# Patient Record
Sex: Male | Born: 1975 | Race: White | Hispanic: No | Smoking: Former smoker
Health system: Southern US, Community
[De-identification: ages and names within clinical notes are randomized; demographics above are authoritative.]

---

## 2005-08-05 ENCOUNTER — Ambulatory Visit: Payer: Self-pay | Admitting: Orthopaedic Surgery

## 2007-01-16 ENCOUNTER — Emergency Department: Payer: Self-pay | Admitting: Emergency Medicine

## 2007-01-16 ENCOUNTER — Other Ambulatory Visit: Payer: Self-pay

## 2013-12-23 ENCOUNTER — Emergency Department: Payer: Self-pay | Admitting: Emergency Medicine

## 2013-12-23 LAB — CBC
HCT: 40.2 % (ref 40.0–52.0)
HGB: 13.8 g/dL (ref 13.0–18.0)
MCH: 31.6 pg (ref 26.0–34.0)
MCHC: 34.3 g/dL (ref 32.0–36.0)
MCV: 92 fL (ref 80–100)
PLATELETS: 185 10*3/uL (ref 150–440)
RBC: 4.36 10*6/uL — ABNORMAL LOW (ref 4.40–5.90)
RDW: 14.3 % (ref 11.5–14.5)
WBC: 5.7 10*3/uL (ref 3.8–10.6)

## 2013-12-23 LAB — TSH: Thyroid Stimulating Horm: 0.498 u[IU]/mL

## 2013-12-23 LAB — BASIC METABOLIC PANEL
Anion Gap: 3 — ABNORMAL LOW (ref 7–16)
BUN: 11 mg/dL (ref 7–18)
CO2: 29 mmol/L (ref 21–32)
Calcium, Total: 9.1 mg/dL (ref 8.5–10.1)
Chloride: 105 mmol/L (ref 98–107)
Creatinine: 0.81 mg/dL (ref 0.60–1.30)
EGFR (African American): >60
EGFR (Non-African Amer.): >60
GLUCOSE: 90 mg/dL (ref 65–99)
Osmolality: 273 (ref 275–301)
Potassium: 3.7 mmol/L (ref 3.5–5.1)
Sodium: 137 mmol/L (ref 136–145)

## 2013-12-23 LAB — TROPONIN I: Troponin-I: <0.02 ng/mL

## 2017-06-02 ENCOUNTER — Encounter: Payer: Self-pay | Admitting: Emergency Medicine

## 2017-06-02 DIAGNOSIS — S161XXA Strain of muscle, fascia and tendon at neck level, initial encounter: Secondary | ICD-10-CM | POA: Diagnosis not present

## 2017-06-02 DIAGNOSIS — Y92410 Unspecified street and highway as the place of occurrence of the external cause: Secondary | ICD-10-CM | POA: Insufficient documentation

## 2017-06-02 DIAGNOSIS — Y939 Activity, unspecified: Secondary | ICD-10-CM | POA: Diagnosis not present

## 2017-06-02 DIAGNOSIS — S39012A Strain of muscle, fascia and tendon of lower back, initial encounter: Secondary | ICD-10-CM | POA: Diagnosis not present

## 2017-06-02 DIAGNOSIS — S199XXA Unspecified injury of neck, initial encounter: Secondary | ICD-10-CM | POA: Diagnosis present

## 2017-06-02 DIAGNOSIS — Y999 Unspecified external cause status: Secondary | ICD-10-CM | POA: Diagnosis not present

## 2017-06-02 DIAGNOSIS — Z87891 Personal history of nicotine dependence: Secondary | ICD-10-CM | POA: Diagnosis not present
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
## 2017-06-03 ENCOUNTER — Emergency Department
Admission: EM | Admit: 2017-06-03 | Discharge: 2017-06-03 | Disposition: A | Discharge status: Home / Self Care | Payer: BLUE CROSS/BLUE SHIELD | Attending: Emergency Medicine | Admitting: Emergency Medicine

## 2017-06-03 ENCOUNTER — Emergency Department: Payer: BLUE CROSS/BLUE SHIELD

## 2017-06-03 DIAGNOSIS — S161XXA Strain of muscle, fascia and tendon at neck level, initial encounter: Secondary | ICD-10-CM

## 2017-06-03 DIAGNOSIS — S39012A Strain of muscle, fascia and tendon of lower back, initial encounter: Secondary | ICD-10-CM

## 2017-06-03 MED ORDER — ACETAMINOPHEN 325 MG PO TABS
ORAL_TABLET | ORAL | Status: AC
  Administered 2017-06-03: 650 mg via ORAL
  Filled 2017-06-03: qty 2

## 2017-06-03 MED ORDER — CYCLOBENZAPRINE HCL 10 MG PO TABS: 10.0000 mg | ORAL_TABLET | Freq: Three times a day (TID) | ORAL | 0 refills | Status: AC | PRN

## 2017-06-03 MED ORDER — CYCLOBENZAPRINE HCL 10 MG PO TABS
10.0000 mg | ORAL_TABLET | Freq: Once | ORAL | Status: AC
  Administered 2017-06-03: 10 mg via ORAL
  Filled 2017-06-03: qty 1

## 2017-06-03 MED ORDER — ACETAMINOPHEN 325 MG PO TABS
650.0000 mg | ORAL_TABLET | Freq: Once | ORAL | Status: AC
  Administered 2017-06-03: 650 mg via ORAL

## 2017-06-03 NOTE — ED Notes (Signed)
Reviewed d/c instructions, follow-up care, prescription with patient. Pt verbalized understanding.  

## 2017-06-03 NOTE — ED Notes (Signed)
Patient tender to palpation of left shoulder.
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
## 2024-04-23 ENCOUNTER — Ambulatory Visit: Payer: Self-pay

## 2024-04-23 NOTE — Telephone Encounter (Signed)
 FYI Only or Action Required?: NA not established  Patient was last seen in primary care on NA. Called Nurse Triage reporting Establish care, fatigue, sore chest vs sob. Symptoms began several days ago. Interventions attempted: Other: urgent care, rest. Symptoms are: gradually improving.  Triage Disposition: See Physician Within 24 Hours  Patient/caregiver understands and will follow disposition?: Unsure  Copied from CRM [id number]. Topic: Clinical - Red Word Triage >> Apr 23, 2024  9:51 AM Jerome Hunter wrote: Red Word that prompted transfer to Nurse Triage:  sob and fatigue. Patient went to urgent care on Thursday for a possible anxiety attack. Reason for Disposition  [1] Chest pain lasts < 5 minutes AND [2] NO chest pain or cardiac symptoms (e.g., breathing difficulty, sweating) now  (Exception: Chest pains that last only a few seconds.)  Answer Assessment - Initial Assessment Questions 1. LOCATION: "Where does it hurt?"       Chest discomfort 2. RADIATION: "Does the pain go anywhere else?" (e.g., into neck, jaw, arms, back)     denies 3. ONSET: "When did the chest pain begin?" (Minutes, hours or days)      Several days ago 4. PATTERN: "Does the pain come and go, or has it been constant since it started?"  "Does it get worse with exertion?"      Intermittent  5. DURATION: "How long does it last" (e.g., seconds, minutes, hours)      6. SEVERITY: "How bad is the pain?"  (e.g., Scale 1-10; mild, moderate, or severe)    - MILD (1-3): doesn't interfere with normal activities     - MODERATE (4-7): interferes with normal activities or awakens from sleep    - SEVERE (8-10): excruciating pain, unable to do any normal activities       mild 7. CARDIAC RISK FACTORS: "Do you have any history of heart problems or risk factors for heart disease?" (e.g., angina, prior heart attack; diabetes, high blood pressure, high cholesterol, smoker, or strong family history of heart disease)     htn 8. PULMONARY  RISK FACTORS: "Do you have any history of lung disease?"  (e.g., blood clots in lung, asthma, emphysema, birth control pills)     NA 9. CAUSE: "What do you think is causing the chest pain?"     Anxiety  10. OTHER SYMPTOMS: "Do you have any other symptoms?" (e.g., dizziness, nausea, vomiting, sweating, fever, difficulty breathing, cough)       Shortness of breath intermittent with ambulation. Anxiety increasing, no diagnosis.  Blood pressure yesterday 175/98, now 115/81, hr 59 Appointment scheduled to establish care, patient will seek evaluation at UC/ER for acute symptoms  Protocols used: Chest Pain-A-AH

## 2024-05-18 ENCOUNTER — Encounter: Payer: Self-pay | Admitting: Family Medicine

## 2024-05-18 ENCOUNTER — Telehealth: Payer: Self-pay

## 2024-05-18 ENCOUNTER — Ambulatory Visit: Admitting: Family Medicine

## 2024-05-18 VITALS — BP 136/78 | HR 70 | Ht 69.69 in | Wt 158.0 lb

## 2024-05-18 DIAGNOSIS — Z7689 Persons encountering health services in other specified circumstances: Secondary | ICD-10-CM

## 2024-05-18 DIAGNOSIS — Z720 Tobacco use: Secondary | ICD-10-CM | POA: Insufficient documentation

## 2024-05-18 DIAGNOSIS — Z8659 Personal history of other mental and behavioral disorders: Secondary | ICD-10-CM | POA: Diagnosis not present

## 2024-05-18 DIAGNOSIS — M5441 Lumbago with sciatica, right side: Secondary | ICD-10-CM

## 2024-05-18 DIAGNOSIS — G8929 Other chronic pain: Secondary | ICD-10-CM | POA: Insufficient documentation

## 2024-05-18 DIAGNOSIS — Z842 Family history of other diseases of the genitourinary system: Secondary | ICD-10-CM

## 2024-05-18 DIAGNOSIS — F5102 Adjustment insomnia: Secondary | ICD-10-CM

## 2024-05-18 DIAGNOSIS — Z1211 Encounter for screening for malignant neoplasm of colon: Secondary | ICD-10-CM

## 2024-05-18 DIAGNOSIS — Z130 Encounter for screening for diseases of the blood and blood-forming organs and certain disorders involving the immune mechanism: Secondary | ICD-10-CM

## 2024-05-18 DIAGNOSIS — Z13220 Encounter for screening for lipoid disorders: Secondary | ICD-10-CM

## 2024-05-18 DIAGNOSIS — Z1159 Encounter for screening for other viral diseases: Secondary | ICD-10-CM

## 2024-05-18 DIAGNOSIS — F411 Generalized anxiety disorder: Secondary | ICD-10-CM | POA: Diagnosis not present

## 2024-05-18 DIAGNOSIS — F410 Panic disorder [episodic paroxysmal anxiety] without agoraphobia: Secondary | ICD-10-CM | POA: Diagnosis not present

## 2024-05-18 DIAGNOSIS — F430 Acute stress reaction: Secondary | ICD-10-CM

## 2024-05-18 DIAGNOSIS — Z131 Encounter for screening for diabetes mellitus: Secondary | ICD-10-CM

## 2024-05-18 DIAGNOSIS — Z114 Encounter for screening for human immunodeficiency virus [HIV]: Secondary | ICD-10-CM

## 2024-05-18 MED ORDER — NA SULFATE-K SULFATE-MG SULF 17.5-3.13-1.6 GM/177ML PO SOLN: 1.0000 | Freq: Once | ORAL | 0 refills | Status: AC
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
## 2024-05-18 NOTE — Telephone Encounter (Signed)
 Gastroenterology Pre-Procedure Review  Request Date: 08/11/24 Requesting Physician: Dr. Jinny  PATIENT REVIEW QUESTIONS: The patient responded to the following health history questions as indicated:    1. Are you having any GI issues? no 2. Do you have a personal history of Polyps? no 3. Do you have a family history of Colon Cancer or Polyps? no 4. Diabetes Mellitus? no 5. Joint replacements in the past 12 months?no 6. Major health problems in the past 3 months?no 7. Any artificial heart valves, MVP, or defibrillator?no    MEDICATIONS & ALLERGIES:    Patient reports the following regarding taking any anticoagulation/antiplatelet therapy:   Plavix, Coumadin, Eliquis, Xarelto, Lovenox, Pradaxa, Brilinta, or Effient? no Aspirin? no  Patient confirms/reports the following medications:  Current Outpatient Medications  Medication Sig Dispense Refill   cyclobenzaprine  (FLEXERIL ) 10 MG tablet Take 1 tablet (10 mg total) by mouth 3 (three) times daily as needed. (Patient not taking: Reported on 05/18/2024) 30 tablet 0   No current facility-administered medications for this visit.    Patient confirms/reports the following allergies:  No Known Allergies  No orders of the defined types were placed in this encounter.   AUTHORIZATION INFORMATION Primary Insurance: 1D#: Group #:  Secondary Insurance: 1D#: Group #:  SCHEDULE INFORMATION: Date: 08/11/24 Time: Location: [facility]

## 2024-05-18 NOTE — Assessment & Plan Note (Signed)
 Flowsheet Row Office Visit from 05/18/2024 in [facility]  PHQ-9 Total Score 5    Chronic He experienced a significant anxiety attack a month ago, prompting a primary care visit. Family history includes severe social anxiety in his mother and brother. He reports ongoing stress from personal circumstances, including animal care and financial issues. He has not used medication recently but uses nicotine and cannabis for symptom management. Therapy, particularly cognitive behavioral therapy, was discussed as beneficial, though he is skeptical but acknowledges potential benefits. - Refer to psychiatry for cognitive behavioral therapy for anxiety and stress management.
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
## 2024-05-18 NOTE — Assessment & Plan Note (Signed)
 Chronic Diagnosed with ADHD in childhood, he has not used medication. He uses nicotine and cannabis for focus and stress. He is reluctant to start prescription medication, preferring non-pharmacological management to avoid altering brain chemistry. - Discuss ADHD management options with psychiatry, focusing on non-pharmacological approaches. - advised against nicotine and illicit substances for management of mood symptoms

## 2024-05-19 ENCOUNTER — Ambulatory Visit: Payer: Self-pay | Admitting: Family Medicine

## 2024-05-19 LAB — LIPID PANEL
Chol/HDL Ratio: 4.3 ratio (ref 0.0–5.0)
Cholesterol, Total: 187 mg/dL (ref 100–199)
HDL: 44 mg/dL (ref >39)
LDL Chol Calc (NIH): 111 mg/dL — ABNORMAL HIGH (ref 0–99)
Triglycerides: 185 mg/dL — ABNORMAL HIGH (ref 0–149)
VLDL Cholesterol Cal: 32 mg/dL (ref 5–40)

## 2024-05-19 LAB — CMP14+EGFR
ALT: 14 IU/L (ref 0–44)
AST: 14 IU/L (ref 0–40)
Albumin: 4.4 g/dL (ref 4.1–5.1)
Alkaline Phosphatase: 59 IU/L (ref 44–121)
BUN/Creatinine Ratio: 14 (ref 9–20)
BUN: 9 mg/dL (ref 6–24)
Bilirubin Total: <0.2 mg/dL (ref 0.0–1.2)
CO2: 22 mmol/L (ref 20–29)
Calcium: 9.7 mg/dL (ref 8.7–10.2)
Chloride: 103 mmol/L (ref 96–106)
Creatinine, Ser: 0.66 mg/dL — ABNORMAL LOW (ref 0.76–1.27)
Globulin, Total: 2.2 g/dL (ref 1.5–4.5)
Glucose: 88 mg/dL (ref 70–99)
Potassium: 4.3 mmol/L (ref 3.5–5.2)
Sodium: 141 mmol/L (ref 134–144)
Total Protein: 6.6 g/dL (ref 6.0–8.5)
eGFR: 116 mL/min/1.73

## 2024-05-19 LAB — CBC
Hematocrit: 44 % (ref 37.5–51.0)
Hemoglobin: 14.5 g/dL (ref 13.0–17.7)
MCH: 31.3 pg (ref 26.6–33.0)
MCHC: 33 g/dL (ref 31.5–35.7)
MCV: 95 fL (ref 79–97)
Platelets: 234 x10E3/uL (ref 150–450)
RBC: 4.63 x10E6/uL (ref 4.14–5.80)
RDW: 12.8 % (ref 11.6–15.4)
WBC: 4.9 x10E3/uL (ref 3.4–10.8)

## 2024-05-19 LAB — TSH+T4F+T3FREE
Free T4: 1.02 ng/dL (ref 0.82–1.77)
T3, Free: 4 pg/mL (ref 2.0–4.4)
TSH: 0.217 u[IU]/mL — ABNORMAL LOW (ref 0.450–4.500)

## 2024-05-19 LAB — HEMOGLOBIN A1C
Est. average glucose Bld gHb Est-mCnc: 111 mg/dL
Hgb A1c MFr Bld: 5.5 % (ref 4.8–5.6)

## 2024-05-19 LAB — HEPATITIS C ANTIBODY: Hep C Virus Ab: NONREACTIVE

## 2024-05-19 LAB — PSA TOTAL (REFLEX TO FREE): Prostate Specific Ag, Serum: 0.4 ng/mL (ref 0.0–4.0)

## 2024-05-19 LAB — HIV ANTIBODY (ROUTINE TESTING W REFLEX): HIV Screen 4th Generation wRfx: NONREACTIVE

## 2024-08-11 ENCOUNTER — Other Ambulatory Visit: Payer: Self-pay

## 2024-08-11 ENCOUNTER — Telehealth: Payer: Self-pay

## 2024-08-11 DIAGNOSIS — Z1211 Encounter for screening for malignant neoplasm of colon: Secondary | ICD-10-CM

## 2024-08-11 MED ORDER — NA SULFATE-K SULFATE-MG SULF 17.5-3.13-1.6 GM/177ML PO SOLN: 1.0000 | Freq: Once | ORAL | 0 refills | Status: AC

## 2024-08-11 NOTE — Telephone Encounter (Signed)
 Pt is requesting call back to discuss colonoscsopy

## 2024-08-11 NOTE — Telephone Encounter (Signed)
 Pt call returned.  Colonoscopy has been rescheduled to 10/04/24.  Pt instructions updated referral updated.  Prep resent to pharmacy.  Thanks,  [place], CMA

## 2024-09-20 ENCOUNTER — Ambulatory Visit: Admitting: Family Medicine

## 2024-10-04 ENCOUNTER — Ambulatory Visit: Admission: RE | Admit: 2024-10-04 | Admitting: Gastroenterology

## 2024-10-04 ENCOUNTER — Telehealth: Payer: Self-pay

## 2024-10-04 ENCOUNTER — Other Ambulatory Visit: Payer: Self-pay

## 2024-10-04 ENCOUNTER — Encounter: Admission: RE | Payer: Self-pay | Source: Home / Self Care

## 2024-10-04 DIAGNOSIS — Z1211 Encounter for screening for malignant neoplasm of colon: Secondary | ICD-10-CM

## 2024-10-04 SURGERY — COLONOSCOPY
Anesthesia: General

## 2024-10-04 NOTE — Telephone Encounter (Signed)
 Patient left message on Friday requesting to reschedule Mondays colonoscopy with Dr. Jinny.  He apologizes for having to reschedule but he is having a tough time.  Colonoscopy has been reordered for 12/14/24 at [facility] with Dr. Jinny.  Instructions updated.  Referral updated.  Thanks,  [place], CMA

## 2024-12-13 ENCOUNTER — Ambulatory Visit: Admitting: Family Medicine

## 2024-12-14 ENCOUNTER — Ambulatory Visit: Admitting: Anesthesiology

## 2024-12-14 ENCOUNTER — Encounter: Payer: Self-pay | Admitting: Gastroenterology

## 2024-12-14 ENCOUNTER — Encounter
Admission: RE | Disposition: A | Discharge status: Home / Self Care | Payer: Self-pay | Source: Home / Self Care | Attending: Gastroenterology

## 2024-12-14 ENCOUNTER — Ambulatory Visit
Admission: RE | Admit: 2024-12-14 | Discharge: 2024-12-14 | Disposition: A | Attending: Gastroenterology | Admitting: Gastroenterology

## 2024-12-14 DIAGNOSIS — K640 First degree hemorrhoids: Secondary | ICD-10-CM | POA: Insufficient documentation

## 2024-12-14 DIAGNOSIS — Z1211 Encounter for screening for malignant neoplasm of colon: Secondary | ICD-10-CM | POA: Diagnosis not present

## 2024-12-14 DIAGNOSIS — Z87891 Personal history of nicotine dependence: Secondary | ICD-10-CM | POA: Insufficient documentation

## 2024-12-14 MED ORDER — SODIUM CHLORIDE 0.9 % IV SOLN: INTRAVENOUS | Status: DC

## 2024-12-14 MED ORDER — PROPOFOL 10 MG/ML IV BOLUS
INTRAVENOUS | Status: DC | PRN
  Administered 2024-12-14: 100 mg via INTRAVENOUS
  Administered 2024-12-14: 50 mg via INTRAVENOUS

## 2024-12-14 NOTE — Anesthesia Preprocedure Evaluation (Signed)
"                                    Anesthesia Evaluation  Patient identified by MRN, date of birth, ID band Patient awake    Reviewed: Allergy & Precautions, H&P , NPO status , Patient's Chart, lab work & pertinent test results, reviewed documented beta blocker date and time   History of Anesthesia Complications Negative for: history of anesthetic complications  Airway Mallampati: I  TM Distance: >3 FB Neck ROM: full    Dental  (+) Dental Advidsory Given, Poor Dentition, Teeth Intact   Pulmonary neg pulmonary ROS, former smoker   Pulmonary exam normal breath sounds clear to auscultation       Cardiovascular Exercise Tolerance: Good negative cardio ROS Normal cardiovascular exam Rhythm:regular Rate:Normal     Neuro/Psych  PSYCHIATRIC DISORDERS Anxiety     negative neurological ROS     GI/Hepatic negative GI ROS, Neg liver ROS,,,  Endo/Other  negative endocrine ROS    Renal/GU negative Renal ROS  negative genitourinary   Musculoskeletal   Abdominal   Peds  Hematology negative hematology ROS (+)   Anesthesia Other Findings History reviewed. No pertinent past medical history.   Reproductive/Obstetrics negative OB ROS                              Anesthesia Physical Anesthesia Plan  ASA: 2  Anesthesia Plan: General   Post-op Pain Management:    Induction: Intravenous  PONV Risk Score and Plan: 2 and Propofol  infusion, TIVA and Treatment may vary due to age or medical condition  Airway Management Planned: Natural Airway and Nasal Cannula  Additional Equipment:   Intra-op Plan:   Post-operative Plan:   Informed Consent: I have reviewed the patients History and Physical, chart, labs and discussed the procedure including the risks, benefits and alternatives for the proposed anesthesia with the patient or authorized representative who has indicated his/her understanding and acceptance.     Dental Advisory  Given  Plan Discussed with: Anesthesiologist, CRNA and Surgeon  Anesthesia Plan Comments:          Anesthesia Quick Evaluation  "

## 2024-12-14 NOTE — Op Note (Signed)
 [facility] Patient Name: Jerome Hunter Procedure Date: 12/14/2024 10:42 AM MRN: [id number] Account #: [id number] Date of Birth: [birth year] Admit Type: Outpatient Age: 49 Room: [facility] ENDO ROOM 4 Gender: Male Note Status: Finalized Instrument Name: Colon Scope [id number] Procedure:             Colonoscopy Indications:           Screening for colorectal malignant neoplasm Providers:             Rogelia Copping MD, MD Referring MD:          No Local Md, MD (Referring MD) Medicines:             Propofol  per Anesthesia Complications:         No immediate complications. Procedure:             Pre-Anesthesia Assessment:                        - Prior to the procedure, a History and Physical was                         performed, and patient medications and allergies were                         reviewed. The patient's tolerance of previous                         anesthesia was also reviewed. The risks and benefits                         of the procedure and the sedation options and risks                         were discussed with the patient. All questions were                         answered, and informed consent was obtained. Prior                         Anticoagulants: The patient has taken no anticoagulant                         or antiplatelet agents. ASA Grade Assessment: II - A                         patient with mild systemic disease. After reviewing                         the risks and benefits, the patient was deemed in                         satisfactory condition to undergo the procedure.                        After obtaining informed consent, the colonoscope was                         passed under direct vision. Throughout the procedure,  the patient's blood pressure, pulse, and oxygen                         saturations were monitored continuously. The                         Colonoscope was introduced through the  anus and                         advanced to the the cecum, identified by appendiceal                         orifice and ileocecal valve. The colonoscopy was                         performed without difficulty. The patient tolerated                         the procedure well. The quality of the bowel                         preparation was excellent. Findings:      The perianal and digital rectal examinations were normal.      Non-bleeding internal hemorrhoids were found during retroflexion. The       hemorrhoids were Grade I (internal hemorrhoids that do not prolapse).      The exam was otherwise without abnormality. Impression:            - Non-bleeding internal hemorrhoids.                        - The examination was otherwise normal.                        - No specimens collected. Recommendation:        - Discharge patient to home.                        - Resume previous diet.                        - Continue present medications.                        - Repeat colonoscopy in 10 years for screening                         purposes. Procedure Code(s):     --- Professional ---                        [phone], Colonoscopy, flexible; diagnostic, including                         collection of specimen(s) by brushing or washing, when                         performed (separate procedure) Diagnosis Code(s):     --- Professional ---                        Z12.11, Encounter for  screening for malignant neoplasm                         of colon CPT copyright 2022 American Medical Association. All rights reserved. The codes documented in this report are preliminary and upon coder review may  be revised to meet current compliance requirements. Rogelia Copping MD, MD 12/14/2024 10:59:14 AM This report has been signed electronically. Number of Addenda: 0 Note Initiated On: 12/14/2024 10:42 AM Scope Withdrawal Time: 0 hours 7 minutes 45 seconds  Total Procedure Duration: 0 hours 11 minutes 32  seconds  Estimated Blood Loss:  Estimated blood loss: none.      [facility]

## 2024-12-14 NOTE — Transfer of Care (Signed)
 Immediate Anesthesia Transfer of Care Note  Patient: Jerome Hunter  Procedure(s) Performed: COLONOSCOPY  Patient Location: PACU  Anesthesia Type:General  Level of Consciousness: drowsy  Airway & Oxygen Therapy: Patient Spontanous Breathing  Post-op Assessment: Report given to RN and Post -op Vital signs reviewed and stable  Post vital signs: Reviewed and stable  Last Vitals:  Vitals Value Taken Time  BP 101/61 12/14/24 11:02  Temp    Pulse 71 12/14/24 11:02  Resp 22 12/14/24 11:02  SpO2 96 % 12/14/24 11:02  Vitals shown include unfiled device data.  Last Pain:  Vitals:   12/14/24 0929  PainSc: 0-No pain         Complications: No notable events documented.

## 2024-12-14 NOTE — H&P (Signed)
 "  Rogelia Copping, MD [facility] [address], [address] [place], [place] [address] Phone: [phone] Fax : [phone]  Primary Care Physician:  Sharma Coyer, MD Primary Gastroenterologist:  Dr. Copping  Pre-Procedure History & Physical: HPI:  Jerome Hunter is a 49 y.o. male is here for a screening colonoscopy.   History reviewed. No pertinent past medical history.  History reviewed. No pertinent surgical history.  Prior to Admission medications  Medication Sig Start Date End Date Taking? Authorizing Provider  cyclobenzaprine  (FLEXERIL ) 10 MG tablet Take 1 tablet (10 mg total) by mouth 3 (three) times daily as needed. Patient not taking: Reported on 05/18/2024 06/03/17   Delores Raford SAILOR, MD    Allergies as of 10/04/2024   (No Known Allergies)    Family History  Problem Relation Age of Onset   Cancer Maternal Grandmother     Social History   Socioeconomic History   Marital status: Divorced    Spouse name: Not on file   Number of children: Not on file   Years of education: Not on file   Highest education level: Not on file  Occupational History   Not on file  Tobacco Use   Smoking status: Former   Smokeless tobacco: Current   Tobacco comments:    Used to smoke almost 1PPD for 20 years per patient         Currently uses nicotine vape, uses the lowest nicotine content   Substance and Sexual Activity   Alcohol use: Yes   Drug use: Yes    Types: Marijuana   Sexual activity: Never  Other Topics Concern   Not on file  Social History Narrative   Not on file   Social Drivers of Health   Tobacco Use: High Risk (12/14/2024)   Patient History    Smoking Tobacco Use: Former    Smokeless Tobacco Use: Current    Passive Exposure: Not on Actuary Strain: Low Risk (05/18/2024)   Overall Financial Resource Strain (CARDIA)    Difficulty of Paying Living Expenses: Not hard at all  Food Insecurity: No Food Insecurity (05/18/2024)   Epic    Worried About  Radiation Protection Practitioner of Food in the Last Year: Never true    Ran Out of Food in the Last Year: Never true  Transportation Needs: No Transportation Needs (05/18/2024)   Epic    Lack of Transportation (Medical): No    Lack of Transportation (Non-Medical): No  Physical Activity: Not on file  Stress: Stress Concern Present (05/18/2024)   [employer] of Occupational Health - Occupational Stress Questionnaire    Feeling of Stress: To some extent  Social Connections: Not on file  Intimate Partner Violence: Not At Risk (05/18/2024)   Epic    Fear of Current or Ex-Partner: No    Emotionally Abused: No    Physically Abused: No    Sexually Abused: No  Depression (PHQ2-9): Medium Risk (05/18/2024)   Depression (PHQ2-9)    PHQ-2 Score: 5  Alcohol Screen: Low Risk (05/18/2024)   Alcohol Screen    Last Alcohol Screening Score (AUDIT): 2  Housing: Unknown (05/18/2024)   Epic    Unable to Pay for Housing in the Last Year: No    Number of Times Moved in the Last Year: Not on file    Homeless in the Last Year: No  Utilities: Not At Risk (05/18/2024)   Epic    Threatened with loss of utilities: No  Health Literacy: Adequate Health Literacy (05/18/2024)  B1300 Health Literacy    Frequency of need for help with medical instructions: Never    Review of Systems: See HPI, otherwise negative ROS  Physical Exam: BP 129/81   Pulse 67   Temp (!) 96.2 F (35.7 C)   Resp 17   Ht 6' 1 (1.854 m)   Wt 71.1 kg   SpO2 99%   BMI 20.69 kg/m  General:   Alert,  pleasant and cooperative in NAD Head:  Normocephalic and atraumatic. Neck:  Supple; no masses or thyromegaly. Lungs:  Clear throughout to auscultation.    Heart:  Regular rate and rhythm. Abdomen:  Soft, nontender and nondistended. Normal bowel sounds, without guarding, and without rebound.   Neurologic:  Alert and  oriented x4;  grossly normal neurologically.  Impression/Plan: Jerome Hunter is now here to undergo a screening colonoscopy.  Risks,  benefits, and alternatives regarding colonoscopy have been reviewed with the patient.  Questions have been answered.  All parties agreeable. "

## 2024-12-27 ENCOUNTER — Ambulatory Visit: Admitting: Family Medicine
